# Patient Record
Sex: Female | Born: 1995 | Race: White | Hispanic: No | Marital: Single | Smoking: Never smoker
Health system: Southern US, Community
[De-identification: ages and names within clinical notes are randomized; demographics above are authoritative.]

## PROBLEM LIST (undated history)

## (undated) DIAGNOSIS — E039 Hypothyroidism, unspecified: Secondary | ICD-10-CM

---
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
## 2018-06-12 IMAGING — DX DG CHEST 2V
2 series · 2 of 2 positions shown · non-contrast
Comparison: None.

CLINICAL DATA: Patient with syncopal episode.  Chest tightness.

EXAM:
CHEST  2 VIEW

[chest pa]
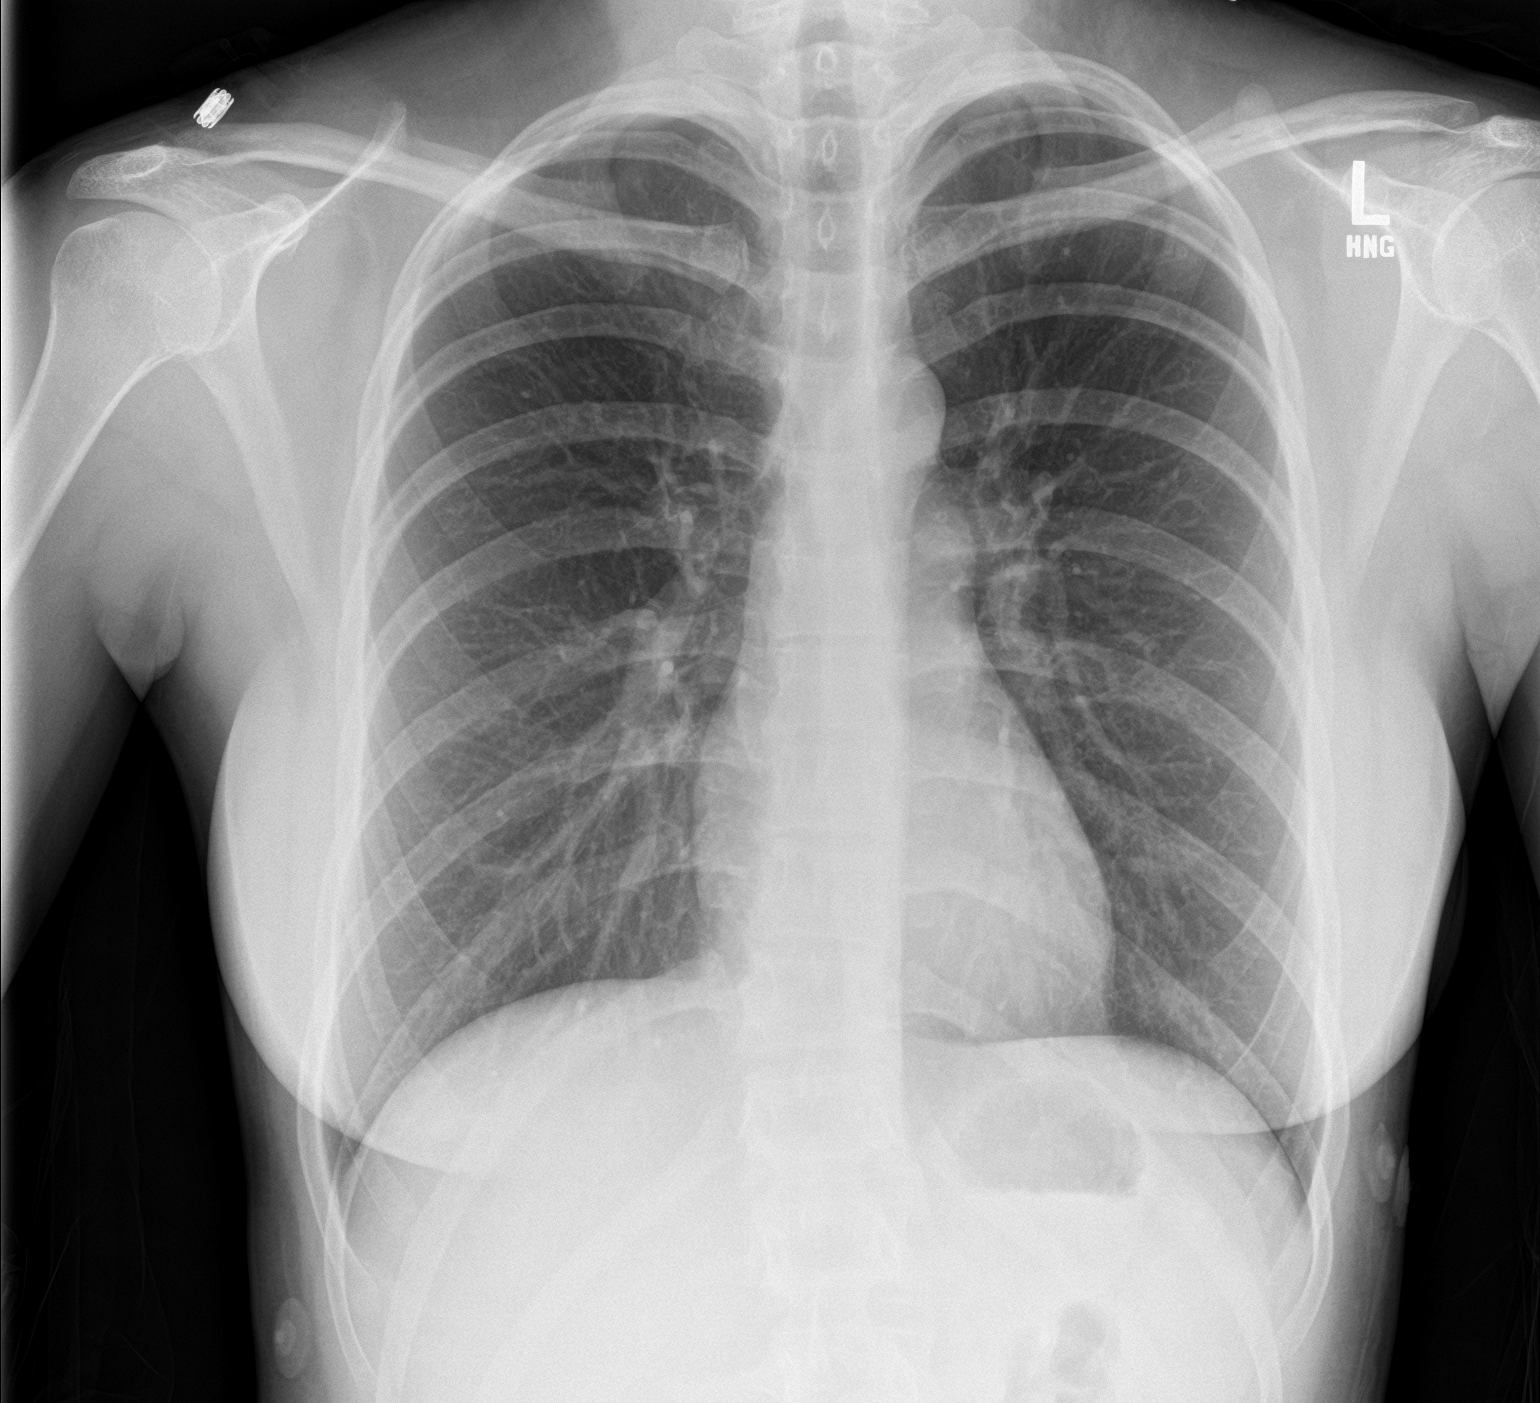

[chest lat]
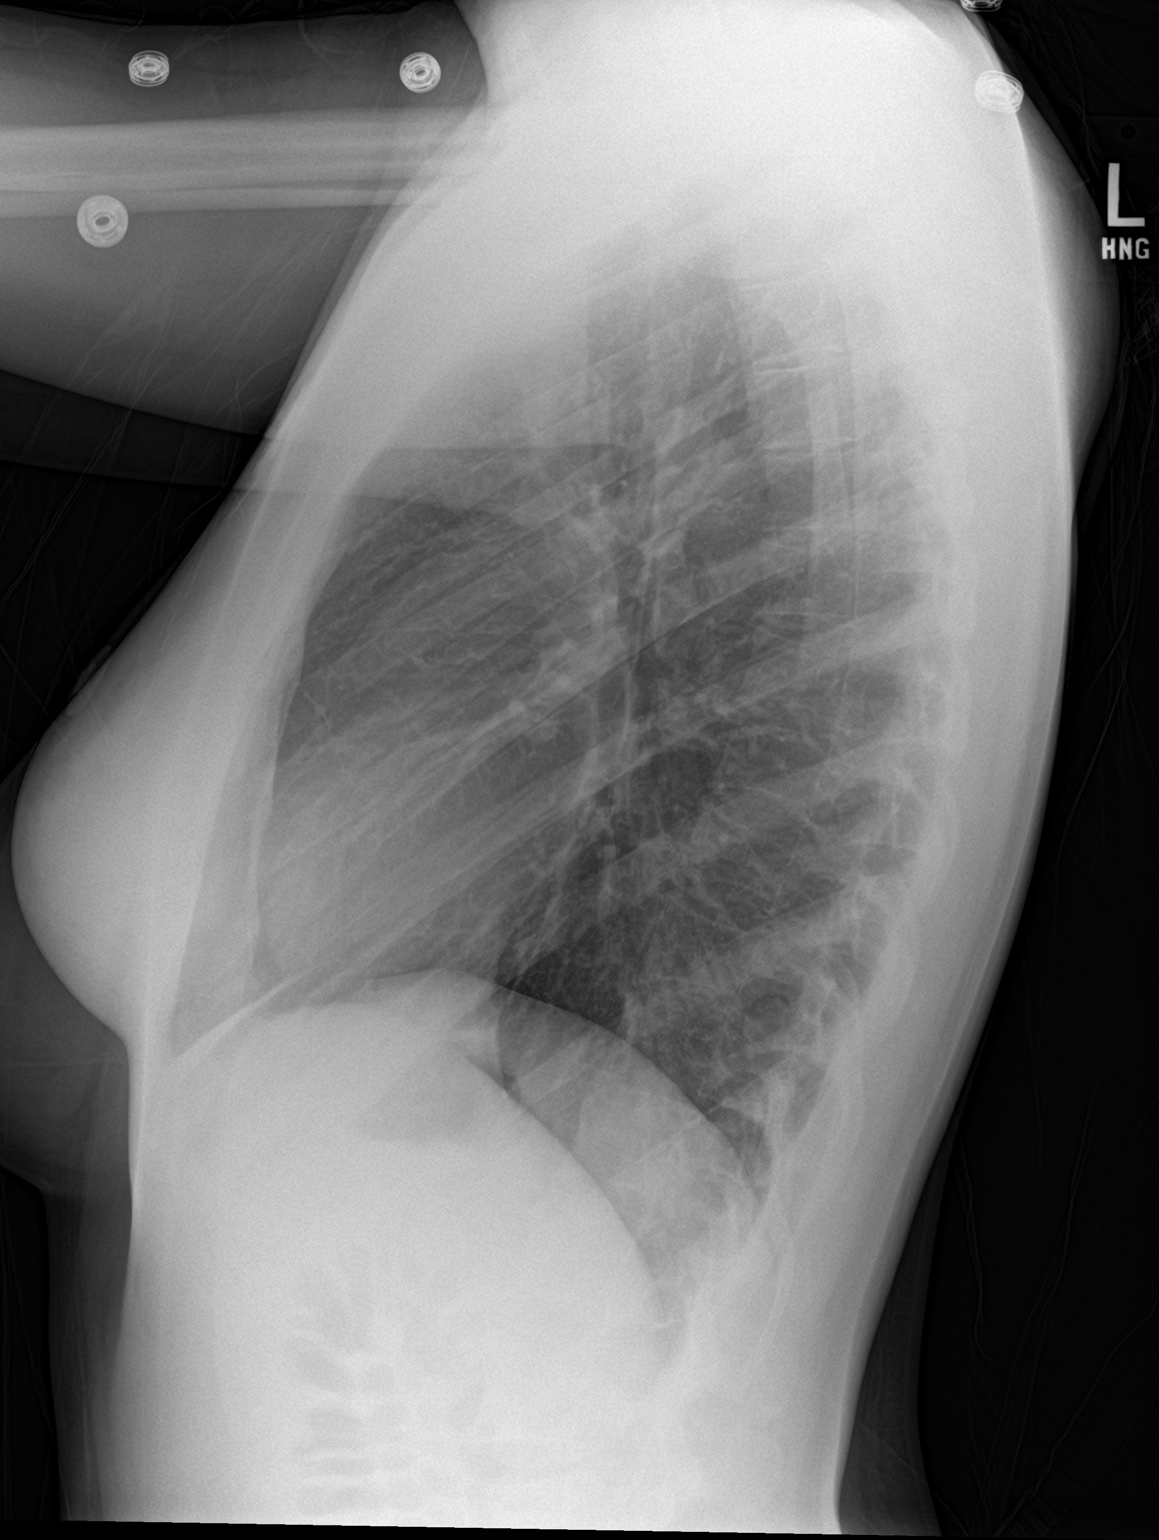

[2 of 2 positions shown; findings below may reference images not displayed]

FINDINGS: The heart size and mediastinal contours are within normal limits.
Both lungs are clear. The visualized skeletal structures are
unremarkable.
IMPRESSION: No active cardiopulmonary disease.

## 2019-02-11 ENCOUNTER — Ambulatory Visit: Admitting: Professional

## 2019-02-13 ENCOUNTER — Ambulatory Visit (INDEPENDENT_AMBULATORY_CARE_PROVIDER_SITE_OTHER): Admitting: Professional

## 2019-02-13 DIAGNOSIS — F411 Generalized anxiety disorder: Secondary | ICD-10-CM | POA: Diagnosis not present

## 2019-02-20 ENCOUNTER — Ambulatory Visit: Admitting: Professional

## 2019-02-27 ENCOUNTER — Other Ambulatory Visit: Admitting: Professional

## 2020-01-30 ENCOUNTER — Ambulatory Visit: Payer: Self-pay | Attending: Internal Medicine

## 2020-01-30 DIAGNOSIS — Z23 Encounter for immunization: Secondary | ICD-10-CM

## 2020-01-30 NOTE — Progress Notes (Signed)
   Covid-19 Vaccination Clinic  Name:  Taylor Pratt    MRN: [id number] DOB: 06-Nov-1995  01/30/2020  [name] was observed post Covid-19 immunization for 15 minutes without incident. She was provided with Vaccine Information Sheet and instruction to access the V-Safe system.   [name] was instructed to call 911 with any severe reactions post vaccine: Marland Kitchen Difficulty breathing  . Swelling of face and throat  . A fast heartbeat  . A bad rash all over body  . Dizziness and weakness   Immunizations Administered    Name Date Dose VIS Date Route   Pfizer COVID-19 Vaccine 01/30/2020 10:44 AM 0.3 mL 10/09/2019 Intramuscular   Manufacturer: [employer], [employer]   Lot: [id number]   NDC: [address]-5

## 2020-05-17 ENCOUNTER — Other Ambulatory Visit: Payer: Self-pay | Admitting: Family Medicine

## 2020-05-17 DIAGNOSIS — N6310 Unspecified lump in the right breast, unspecified quadrant: Secondary | ICD-10-CM

## 2020-05-25 ENCOUNTER — Other Ambulatory Visit: Payer: Self-pay

## 2020-05-25 ENCOUNTER — Ambulatory Visit
Admission: RE | Admit: 2020-05-25 | Discharge: 2020-05-25 | Disposition: A | Discharge status: Home / Self Care | Payer: Managed Care, Other (non HMO) | Source: Ambulatory Visit | Attending: Family Medicine | Admitting: Family Medicine

## 2020-05-25 DIAGNOSIS — N6310 Unspecified lump in the right breast, unspecified quadrant: Secondary | ICD-10-CM

## 2022-01-08 IMAGING — US US BREAST*R* LIMITED INC AXILLA
1 series · 2 of 2 positions shown · non-contrast
Comparison: None.

CLINICAL DATA: 24-year-old female complaining of a palpable
abnormality in the 12 o'clock region of the right breast.

EXAM:
ULTRASOUND OF THE RIGHT BREAST

[Series 1: us breast*right* limited inc axilla · 0.06mm/px · 2 of 2 slices shown]
[im 1/2]
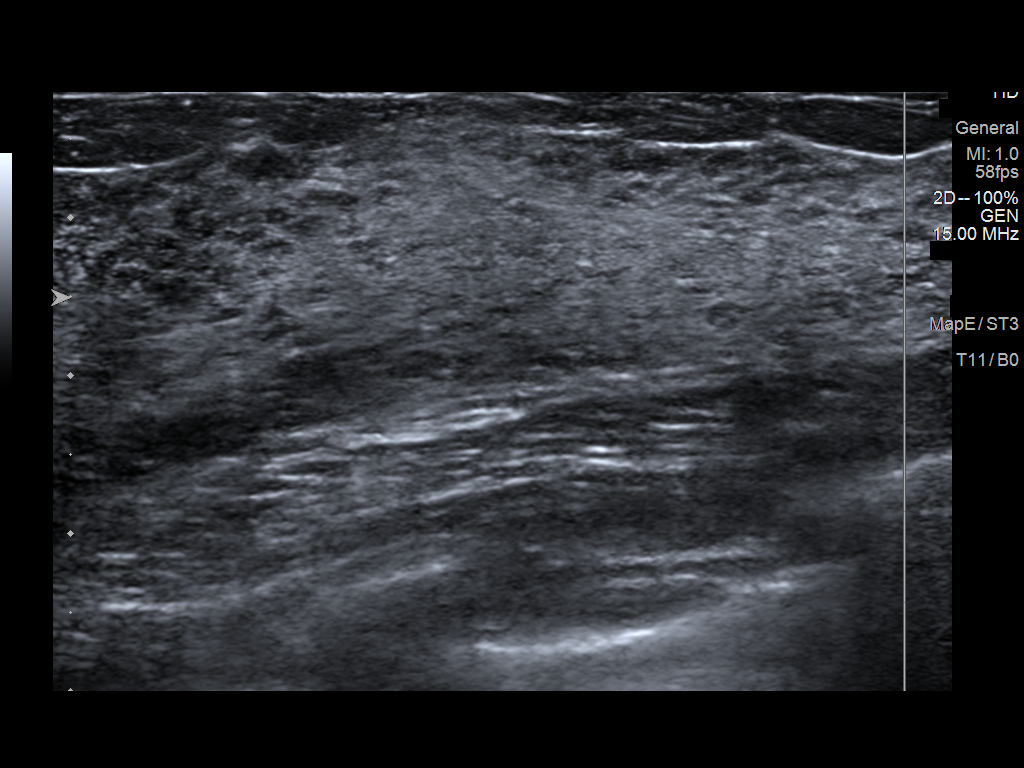
[im 2/2]
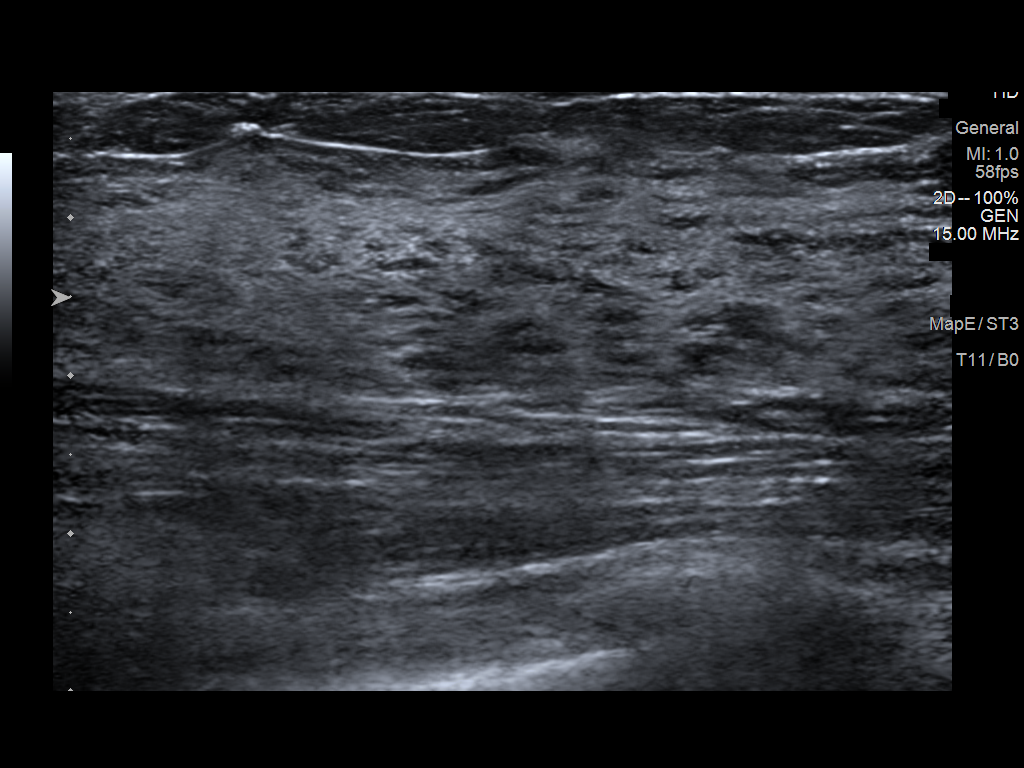

[2 of 2 positions shown; findings below may reference images not displayed]

FINDINGS: On physical exam, I do not palpate a discrete mass in the right
breast at 12 o'clock 4 cm from the nipple.

Targeted ultrasound is performed, showing normal tissue in the area
of clinical concern in the right breast at 12 o'clock 4 cm from the
nipple.
IMPRESSION: No sonographic evidence of the area of clinical concern in the right
breast at 12 o'clock 4 cm from the nipple.

RECOMMENDATION:
If the clinical exam remains benign/stable screening mammography can
be deferred until the age of 42.

I have discussed the findings and recommendations with the patient.
If applicable, a reminder letter will be sent to the patient
regarding the next appointment.

BI-RADS CATEGORY  1: Negative.
